# Patient Record
Sex: Male | Born: 1955 | Race: White | Hispanic: No | Smoking: Former smoker
Health system: Southern US, Community
[De-identification: ages and names within clinical notes are randomized; demographics above are authoritative.]

## PROBLEM LIST (undated history)

## (undated) DIAGNOSIS — I639 Cerebral infarction, unspecified: Secondary | ICD-10-CM

## (undated) DIAGNOSIS — K219 Gastro-esophageal reflux disease without esophagitis: Secondary | ICD-10-CM

## (undated) DIAGNOSIS — D61818 Other pancytopenia: Secondary | ICD-10-CM

## (undated) DIAGNOSIS — Z95828 Presence of other vascular implants and grafts: Secondary | ICD-10-CM

## (undated) DIAGNOSIS — N319 Neuromuscular dysfunction of bladder, unspecified: Secondary | ICD-10-CM

## (undated) DIAGNOSIS — D696 Thrombocytopenia, unspecified: Secondary | ICD-10-CM

## (undated) DIAGNOSIS — G528 Disorders of other specified cranial nerves: Secondary | ICD-10-CM

## (undated) DIAGNOSIS — I340 Nonrheumatic mitral (valve) insufficiency: Secondary | ICD-10-CM

## (undated) DIAGNOSIS — E7800 Pure hypercholesterolemia, unspecified: Secondary | ICD-10-CM

## (undated) DIAGNOSIS — N200 Calculus of kidney: Secondary | ICD-10-CM

## (undated) DIAGNOSIS — C801 Malignant (primary) neoplasm, unspecified: Secondary | ICD-10-CM

## (undated) DIAGNOSIS — E119 Type 2 diabetes mellitus without complications: Secondary | ICD-10-CM

## (undated) DIAGNOSIS — I2510 Atherosclerotic heart disease of native coronary artery without angina pectoris: Secondary | ICD-10-CM

## (undated) DIAGNOSIS — I10 Essential (primary) hypertension: Secondary | ICD-10-CM

## (undated) DIAGNOSIS — J45909 Unspecified asthma, uncomplicated: Secondary | ICD-10-CM

## (undated) HISTORY — PX: NECK SURGERY: SHX720

## (undated) HISTORY — PX: CHOLECYSTECTOMY: SHX55

## (undated) HISTORY — PX: SPHINCTEROTOMY: SHX5279

## (undated) HISTORY — PX: SPINE SURGERY: SHX786

## (undated) HISTORY — PX: ERCP: SHX60

---

## 2002-07-06 DIAGNOSIS — I340 Nonrheumatic mitral (valve) insufficiency: Secondary | ICD-10-CM

## 2002-07-06 HISTORY — DX: Nonrheumatic mitral (valve) insufficiency: I34.0

## 2012-04-20 ENCOUNTER — Emergency Department: Payer: Self-pay | Admitting: Emergency Medicine

## 2014-10-04 ENCOUNTER — Ambulatory Visit
Admit: 2014-10-04 | Disposition: A | Discharge status: Home / Self Care | Payer: Self-pay | Attending: Family Medicine | Admitting: Family Medicine

## 2014-11-02 ENCOUNTER — Ambulatory Visit
Admit: 2014-11-02 | Disposition: A | Discharge status: Home / Self Care | Payer: Self-pay | Attending: Family Medicine | Admitting: Family Medicine

## 2014-11-02 LAB — RAPID STREP-A WITH REFLX: Micro Text Report: NEGATIVE

## 2014-11-05 LAB — BETA STREP CULTURE(ARMC)

## 2015-04-06 DIAGNOSIS — I639 Cerebral infarction, unspecified: Secondary | ICD-10-CM

## 2015-04-06 HISTORY — DX: Cerebral infarction, unspecified: I63.9

## 2015-07-10 ENCOUNTER — Encounter: Payer: Self-pay | Admitting: Gynecology

## 2015-07-10 ENCOUNTER — Ambulatory Visit
Admission: EM | Admit: 2015-07-10 | Discharge: 2015-07-10 | Disposition: A | Discharge status: Home / Self Care | Payer: BLUE CROSS/BLUE SHIELD | Attending: Family Medicine | Admitting: Family Medicine

## 2015-07-10 DIAGNOSIS — J029 Acute pharyngitis, unspecified: Secondary | ICD-10-CM | POA: Diagnosis present

## 2015-07-10 DIAGNOSIS — J4521 Mild intermittent asthma with (acute) exacerbation: Secondary | ICD-10-CM | POA: Insufficient documentation

## 2015-07-10 HISTORY — DX: Gastro-esophageal reflux disease without esophagitis: K21.9

## 2015-07-10 HISTORY — DX: Disorders of other specified cranial nerves: G52.8

## 2015-07-10 HISTORY — DX: Presence of other vascular implants and grafts: Z95.828

## 2015-07-10 HISTORY — DX: Nonrheumatic mitral (valve) insufficiency: I34.0

## 2015-07-10 HISTORY — DX: Neuromuscular dysfunction of bladder, unspecified: N31.9

## 2015-07-10 HISTORY — DX: Other pancytopenia: D61.818

## 2015-07-10 HISTORY — DX: Malignant (primary) neoplasm, unspecified: C80.1

## 2015-07-10 HISTORY — DX: Atherosclerotic heart disease of native coronary artery without angina pectoris: I25.10

## 2015-07-10 HISTORY — DX: Pure hypercholesterolemia, unspecified: E78.00

## 2015-07-10 HISTORY — DX: Cerebral infarction, unspecified: I63.9

## 2015-07-10 HISTORY — DX: Essential (primary) hypertension: I10

## 2015-07-10 HISTORY — DX: Type 2 diabetes mellitus without complications: E11.9

## 2015-07-10 HISTORY — DX: Thrombocytopenia, unspecified: D69.6

## 2015-07-10 HISTORY — DX: Unspecified asthma, uncomplicated: J45.909

## 2015-07-10 HISTORY — DX: Calculus of kidney: N20.0

## 2015-07-10 LAB — RAPID STREP SCREEN (MED CTR MEBANE ONLY): Streptococcus, Group A Screen (Direct): NEGATIVE

## 2015-07-10 MED ORDER — GUAIFENESIN-CODEINE 100-10 MG/5ML PO SOLN: ORAL | Status: DC

## 2015-07-10 MED ORDER — AZITHROMYCIN 250 MG PO TABS: ORAL_TABLET | ORAL | Status: DC

## 2015-07-10 NOTE — ED Provider Notes (Signed)
CSN: [id number]     Arrival date & time 07/10/15  [id number] History   First MD Initiated Contact with Patient 07/10/15 [phone]     Chief Complaint  Patient presents with  . Sore Throat   (Consider location/radiation/quality/duration/timing/severity/associated sxs/prior Treatment) Patient is a 60 y.o. male presenting with pharyngitis and URI. The history is provided by the patient.  Sore Throat  URI Presenting symptoms: congestion, cough, fatigue, fever, rhinorrhea and sore throat   Severity:  Moderate Onset quality:  Sudden Duration:  2 days Timing:  Constant Progression:  Worsening Chronicity:  New Relieved by:  Inhaler Associated symptoms: wheezing   Risk factors: chronic respiratory disease, diabetes mellitus and sick contacts     Past Medical History  Diagnosis Date  . Asthma   . Cancer ([place])     skin  . Diabetes mellitus without complication ([place])   . Hypertension   . Stroke ([place]) 04/2015  . MI (mitral incompetence) 2004  . Hypercholesteremia   . Spinal accessory neuropathy   . Port-a-cath in place     urination  . GERD (gastroesophageal reflux disease)   . Hypercholesteremia   . Thrombocytopenia ([place])   . Pancytopenia ([place])   . Neurogenic bladder   . Kidney stone   . Coronary artery disease    Past Surgical History  Procedure Laterality Date  . Cholecystectomy    . Spine surgery    . Neck surgery    . Ercp    . Sphincterotomy     No family history on file. Social History  Substance Use Topics  . Smoking status: Former Research scientist (life sciences)  . Smokeless tobacco: None  . Alcohol Use: No    Review of Systems  Constitutional: Positive for fever and fatigue.  HENT: Positive for congestion, rhinorrhea and sore throat.   Respiratory: Positive for cough and wheezing.     Allergies  Penicillins; Banana; Fioricet; and Levofloxacin  Home Medications   Prior to Admission medications   Medication Sig Start Date End Date Taking? Authorizing Provider  Albuterol Sulfate 108  (90 Base) MCG/ACT AEPB Inhale into the lungs.   Yes Historical Provider, MD  ALPRAZolam Duanne Moron) 0.25 MG tablet Take 0.25 mg by mouth at bedtime as needed for anxiety.   Yes Historical Provider, MD  aspirin 81 MG tablet Take 81 mg by mouth daily.   Yes Historical Provider, MD  atorvastatin (LIPITOR) 80 MG tablet Take 80 mg by mouth daily.   Yes Historical Provider, MD  citalopram (CELEXA) 20 MG tablet Take 20 mg by mouth daily.   Yes Historical Provider, MD  Fluticasone-Salmeterol (ADVAIR) 100-50 MCG/DOSE AEPB Inhale 1 puff into the lungs 2 (two) times daily.   Yes Historical Provider, MD  gabapentin (NEURONTIN) 300 MG capsule Take 300 mg by mouth 3 (three) times daily.   Yes Historical Provider, MD  glucose blood test strip 1 each by Other route as needed for other. Use as instructed   Yes Historical Provider, MD  insulin glargine (LANTUS) 100 UNIT/ML injection Inject into the skin at bedtime.   Yes Historical Provider, MD  insulin lispro (HUMALOG) 100 UNIT/ML injection Inject into the skin 3 (three) times daily before meals.   Yes Historical Provider, MD  losartan (COZAAR) 25 MG tablet Take 25 mg by mouth daily.   Yes Historical Provider, MD  metaxalone (SKELAXIN) 800 MG tablet Take 800 mg by mouth 3 (three) times daily.   Yes Historical Provider, MD  metFORMIN (GLUCOPHAGE) 1000 MG tablet Take 1,000 mg by mouth  2 (two) times daily with a meal.   Yes Historical Provider, MD  Multiple Vitamins-Minerals (MULTIVITAMIN WITH MINERALS) tablet Take 1 tablet by mouth daily.   Yes Historical Provider, MD  nitrofurantoin, macrocrystal-monohydrate, (MACROBID) 100 MG capsule Take 100 mg by mouth 2 (two) times daily.   Yes Historical Provider, MD  oxybutynin (DITROPAN-XL) 10 MG 24 hr tablet Take 10 mg by mouth at bedtime.   Yes Historical Provider, MD  oxycodone (OXY-IR) 5 MG capsule Take 5 mg by mouth every 4 (four) hours as needed.   Yes Historical Provider, MD  tiZANidine (ZANAFLEX) 4 MG tablet Take 4 mg by  mouth every 6 (six) hours as needed for muscle spasms.   Yes Historical Provider, MD  traZODone (DESYREL) 150 MG tablet Take by mouth at bedtime.   Yes Historical Provider, MD  vitamin E (VITAMIN E) 400 UNIT capsule Take 400 Units by mouth daily.   Yes Historical Provider, MD  azithromycin (ZITHROMAX Z-PAK) 250 MG tablet 2 tabs po once day 1, then 1 tab po qd for next 4 days 07/10/15   Norval Gable, MD  guaiFENesin-codeine 100-10 MG/5ML syrup 1-2 teaspoons po qhs prn cough 07/10/15   Norval Gable, MD   Meds Ordered and Administered this Visit  Medications - No data to display  BP 112/50 mmHg  Pulse 93  Temp(Src) 99.2 F (37.3 C) (Tympanic)  Resp 18  Ht 5\' 10"  (1.778 m)  Wt 250 lb (113.399 kg)  BMI 35.87 kg/m2  SpO2 96% No data found.   Physical Exam  Constitutional: He appears well-developed and well-nourished. No distress.  HENT:  Head: Normocephalic and atraumatic.  Right Ear: Tympanic membrane, external ear and ear canal normal.  Left Ear: Tympanic membrane, external ear and ear canal normal.  Nose: Rhinorrhea present.  Mouth/Throat: Uvula is midline and mucous membranes are normal. Posterior oropharyngeal erythema present. No oropharyngeal exudate, posterior oropharyngeal edema or tonsillar abscesses.  Eyes: Conjunctivae and EOM are normal. Pupils are equal, round, and reactive to light. Right eye exhibits no discharge. Left eye exhibits no discharge. No scleral icterus.  Neck: Normal range of motion. Neck supple. No tracheal deviation present. No thyromegaly present.  Cardiovascular: Normal rate, regular rhythm and normal heart sounds.   Pulmonary/Chest: Effort normal. No stridor. No respiratory distress. He has wheezes (few expiratory wheezes bilaterally and diffuse rhonchi bilaterally). He has no rales. He exhibits no tenderness.  Lymphadenopathy:    He has no cervical adenopathy.  Neurological: He is alert.  Skin: Skin is warm and dry. No rash noted. He is not diaphoretic.   Nursing note and vitals reviewed.   ED Course  Procedures (including critical care time)  Labs Review Labs Reviewed  RAPID STREP SCREEN (NOT AT [facility])  CULTURE, GROUP A STREP ([facility] ONLY)    Imaging Review No results found.   Visual Acuity Review  Right Eye Distance:   Left Eye Distance:   Bilateral Distance:    Right Eye Near:   Left Eye Near:    Bilateral Near:         MDM   1. Asthmatic bronchitis, mild intermittent, with acute exacerbation    Discharge Medication List as of 07/10/2015 10:23 AM    START taking these medications   Details  azithromycin (ZITHROMAX Z-PAK) 250 MG tablet 2 tabs po once day 1, then 1 tab po qd for next 4 days, Normal    guaiFENesin-codeine 100-10 MG/5ML syrup 1-2 teaspoons po qhs prn cough, Print  1. Labs results and diagnosis reviewed with patient 2. rx as per orders above; reviewed possible side effects, interactions, risks and benefits  3. Recommend supportive treatment with rest, increased fluids; continue current home inhalers (albuterol and Advair) 4. Follow-up prn if symptoms worsen or don't improve    Norval Gable, MD 07/10/15 1031

## 2015-07-10 NOTE — ED Notes (Signed)
Patient c/o sore throat / heavy breathing (history of asthma) chest congestion / greenish mucous x yesterday.

## 2015-07-12 LAB — CULTURE, GROUP A STREP (THRC)

## 2017-09-30 ENCOUNTER — Ambulatory Visit
Admission: EM | Admit: 2017-09-30 | Discharge: 2017-09-30 | Disposition: A | Discharge status: Home / Self Care | Payer: BLUE CROSS/BLUE SHIELD | Attending: Family Medicine | Admitting: Family Medicine

## 2017-09-30 ENCOUNTER — Other Ambulatory Visit: Payer: Self-pay

## 2017-09-30 ENCOUNTER — Ambulatory Visit (INDEPENDENT_AMBULATORY_CARE_PROVIDER_SITE_OTHER): Payer: BLUE CROSS/BLUE SHIELD

## 2017-09-30 DIAGNOSIS — R0602 Shortness of breath: Secondary | ICD-10-CM

## 2017-09-30 DIAGNOSIS — J069 Acute upper respiratory infection, unspecified: Secondary | ICD-10-CM

## 2017-09-30 DIAGNOSIS — R062 Wheezing: Secondary | ICD-10-CM

## 2017-09-30 DIAGNOSIS — R05 Cough: Secondary | ICD-10-CM

## 2017-09-30 MED ORDER — IPRATROPIUM-ALBUTEROL 0.5-2.5 (3) MG/3ML IN SOLN
3.0000 mL | Freq: Once | RESPIRATORY_TRACT | Status: AC
  Administered 2017-09-30: 3 mL via RESPIRATORY_TRACT

## 2017-09-30 MED ORDER — AZITHROMYCIN 250 MG PO TABS: 250.0000 mg | ORAL_TABLET | Freq: Every day | ORAL | 0 refills | Status: AC

## 2017-09-30 MED ORDER — BENZONATATE 200 MG PO CAPS: ORAL_CAPSULE | ORAL | 0 refills | Status: AC

## 2017-09-30 MED ORDER — ALBUTEROL SULFATE HFA 108 (90 BASE) MCG/ACT IN AERS: 1.0000 | INHALATION_SPRAY | Freq: Four times a day (QID) | RESPIRATORY_TRACT | 0 refills | Status: DC | PRN

## 2017-09-30 MED ORDER — ALBUTEROL SULFATE HFA 108 (90 BASE) MCG/ACT IN AERS: 1.0000 | INHALATION_SPRAY | Freq: Four times a day (QID) | RESPIRATORY_TRACT | 0 refills | Status: AC | PRN

## 2017-09-30 MED ORDER — HYDROCODONE-HOMATROPINE 5-1.5 MG/5ML PO SYRP: 5.0000 mL | ORAL_SOLUTION | Freq: Four times a day (QID) | ORAL | 0 refills | Status: AC | PRN

## 2017-09-30 NOTE — ED Provider Notes (Signed)
[facility]    CSN: [id number] Arrival date & time: 09/30/17  0830     History   Chief Complaint Chief Complaint  Patient presents with  . Cough  . Wheezing    HPI Cameron Johnston is a 62 y.o. male.   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  He states that whenever he is recumbent at night he is coughing all night long.  Had similar symptoms a month ago which never improved.  He visited [facility] and they told him that he has to have the cough for over 2 weeks before being treated.  Does not have any sinus issues it is all in his chest.  He does not remember any sick contacts.  He is afebrile but has not been running a fever.  Respirations are 24 O2 sats are 98% on room air with audible wheezing.  History of asthma and diabetes along with other comorbidities.        Past Medical History:  Diagnosis Date  . Asthma   . Cancer ([place])    skin  . Coronary artery disease   . Diabetes mellitus without complication ([place])   . GERD (gastroesophageal reflux disease)   . Hypercholesteremia   . Hypercholesteremia   . Hypertension   . Kidney stone   . MI (mitral incompetence) 2004  . Neurogenic bladder   . Pancytopenia ([place])   . Port-A-Cath in place    urination  . Spinal accessory neuropathy   . Stroke ([place]) 04/2015  . Thrombocytopenia ([place])     There are no active problems to display for this patient.   Past Surgical History:  Procedure Laterality Date  . CHOLECYSTECTOMY    . ERCP    . NECK SURGERY    . SPHINCTEROTOMY    . SPINE SURGERY         Home Medications    Prior to Admission medications   Medication Sig Start Date End Date Taking? Authorizing Provider  baclofen (LIORESAL) 10 MG tablet Take 10 mg by mouth at bedtime as needed for muscle spasms.   Yes [provider]  Dulaglutide (TRULICITY) 1.5  TD/3.2KG SOPN Inject into the skin.   Yes [provider]  albuterol (PROVENTIL HFA;VENTOLIN HFA) 108 (90 Base) MCG/ACT inhaler Inhale 1-2 puffs into the lungs every 6 (six) hours as needed for wheezing or shortness of breath. 09/30/17   Lorin Picket, PA-C  ALPRAZolam Duanne Moron) 0.25 MG tablet Take 0.25 mg by mouth at bedtime as needed for anxiety.    [provider]  aspirin 81 MG tablet Take 81 mg by mouth daily.    [provider]  atorvastatin (LIPITOR) 80 MG tablet Take 80 mg by mouth daily.    [provider]  azithromycin (ZITHROMAX) 250 MG tablet Take 1 tablet (250 mg total) by mouth daily. Take first 2 tablets together, then 1 every day until finished. 09/30/17   Lorin Picket, PA-C  benzonatate (TESSALON) 200 MG capsule Take one cap TID PRN cough 09/30/17   Lorin Picket, PA-C  citalopram (CELEXA) 20 MG tablet Take 20 mg by mouth daily.    [provider]  Fluticasone-Salmeterol (ADVAIR) 100-50 MCG/DOSE AEPB Inhale 1 puff into the lungs 2 (two) times daily.    [provider]  gabapentin (NEURONTIN) 300 MG capsule Take 300 mg by mouth 3 (three) times  daily.    [provider]  glucose blood test strip 1 each by Other route as needed for other. Use as instructed    [provider]  HYDROcodone-homatropine (HYCODAN) 5-1.5 MG/5ML syrup Take 5 mLs by mouth every 6 (six) hours as needed for cough. 09/30/17   Lorin Picket, PA-C  insulin glargine (LANTUS) 100 UNIT/ML injection Inject into the skin at bedtime.    [provider]  insulin lispro (HUMALOG) 100 UNIT/ML injection Inject into the skin 3 (three) times daily before meals.    [provider]  losartan (COZAAR) 25 MG tablet Take 25 mg by mouth daily.    [provider]  metaxalone (SKELAXIN) 800 MG tablet Take 800 mg by mouth 3 (three) times daily.    [provider]  metFORMIN (GLUCOPHAGE) 1000 MG tablet Take 1,000 mg by  mouth 2 (two) times daily with a meal.    [provider]  Multiple Vitamins-Minerals (MULTIVITAMIN WITH MINERALS) tablet Take 1 tablet by mouth daily.    [provider]  oxybutynin (DITROPAN-XL) 10 MG 24 hr tablet Take 10 mg by mouth at bedtime.    [provider]  oxycodone (OXY-IR) 5 MG capsule Take 5 mg by mouth every 4 (four) hours as needed.    [provider]  tiZANidine (ZANAFLEX) 4 MG tablet Take 4 mg by mouth every 6 (six) hours as needed for muscle spasms.    [provider]  traZODone (DESYREL) 150 MG tablet Take by mouth at bedtime.    [provider]  vitamin E (VITAMIN E) 400 UNIT capsule Take 400 Units by mouth daily.    [provider]    Family History History reviewed. No pertinent family history.  Social History Social History   Tobacco Use  . Smoking status: Former Research scientist (life sciences)  . Smokeless tobacco: Never Used  Substance Use Topics  . Alcohol use: No  . Drug use: No     Allergies   Penicillins; Banana; Fioricet [butalbital-apap-caffeine]; and Levofloxacin   Review of Systems Review of Systems  Constitutional: Negative for activity change, appetite change, chills, fatigue and fever.  HENT: Positive for congestion.   Respiratory: Positive for cough, shortness of breath and wheezing.   All other systems reviewed and are negative.    Physical Exam Triage Vital Signs ED Triage Vitals  Enc Vitals Group     BP 09/30/17 0846 140/61     Pulse Rate 09/30/17 0846 95     Resp 09/30/17 0846 (!) 24     Temp 09/30/17 0846 98.5 F (36.9 C)     Temp Source 09/30/17 0846 Oral     SpO2 09/30/17 0846 98 %     Weight 09/30/17 0847 223 lb (101.2 kg)     Height 09/30/17 0847 5\' 10"  (1.778 m)     Head Circumference --      Peak Flow --      Pain Score 09/30/17 0847 0     Pain Loc --      Pain Edu? --      Excl. in [place]? --    No data found.  Updated Vital Signs BP 140/61   Pulse 95   Temp 98.5 F (36.9  C) (Oral)   Resp (!) 24   Ht 5\' 10"  (1.778 m)   Wt 223 lb (101.2 kg)   SpO2 98%   BMI 32.00 kg/m   Visual Acuity Right Eye Distance:   Left Eye Distance:  Bilateral Distance:    Right Eye Near:   Left Eye Near:    Bilateral Near:     Physical Exam  Constitutional: He is oriented to person, place, and time. He appears well-developed and well-nourished. No distress.  HENT:  Head: Normocephalic.  Right Ear: External ear normal.  Left Ear: External ear normal.  Nose: Nose normal.  Mouth/Throat: Oropharynx is clear and moist. No oropharyngeal exudate.  Right canal is occluded with cerumen  Eyes: Pupils are equal, round, and reactive to light. Right eye exhibits no discharge. Left eye exhibits no discharge.  Neck: Normal range of motion. Neck supple.  Pulmonary/Chest: Effort normal. He has wheezes. He has rales.  Musculoskeletal: Normal range of motion.  Lymphadenopathy:    He has no cervical adenopathy.  Neurological: He is alert and oriented to person, place, and time.  Skin: Skin is warm and dry. He is not diaphoretic.  Psychiatric: He has a normal mood and affect. His behavior is normal. Judgment and thought content normal.  Nursing note and vitals reviewed.    UC Treatments / Results  Labs (all labs ordered are listed, but only abnormal results are displayed) Labs Reviewed - No data to display  EKG None Radiology Dg Chest 2 View  Result Date: 09/30/2017 CLINICAL DATA:  Productive cough and shortness of breath for the past week. History of asthma, former smoker. EXAM: CHEST - 2 VIEW COMPARISON:  Chest x-ray of October 04, 2014 FINDINGS: The lungs are adequately inflated. The interstitial markings are mildly increased overall. The cardiac silhouette remains enlarged. The central pulmonary vascularity is prominent and more conspicuous than on the previous study. There is no pleural effusion. The mediastinum is normal in width. The bony thorax exhibits no acute  abnormalities. There is chronic deformity of the left scapula. IMPRESSION: Findings suggestive of low-grade interstitial edema of cardiac or noncardiac cause. No alveolar pneumonia. Electronically Signed   By: David  Martinique M.D.   On: 09/30/2017 10:13    Procedures Procedures (including critical care time)  Medications Ordered in UC Medications  ipratropium-albuterol (DUONEB) 0.5-2.5 (3) MG/3ML nebulizer solution 3 mL (3 mLs Nebulization Given 09/30/17 1013)     Initial Impression / Assessment and Plan / UC Course  I have reviewed the triage vital signs and the nursing notes.  Pertinent labs & imaging results that were available during my care of the patient were reviewed by me and considered in my medical decision making (see chart for details).     Plan: 1. Test/x-ray results and diagnosis reviewed with patient 2. rx as per orders; risks, benefits, potential side effects reviewed with patient 3. Recommend supportive treatment with rest and limited fluids.  States he has no cardiac history and states coronary artery disease was actually from muscle spasm according to the patient.  Had no history of CHF.  He worsens I have told him he should go to the emergency room for further evaluation because of the interstitial edema most likely not from a cardiac source. 4. F/u prn if symptoms worsen or don't improve   Final Clinical Impressions(s) / UC Diagnoses   Final diagnoses:  Upper respiratory tract infection, unspecified type    ED Discharge Orders        Ordered    azithromycin (ZITHROMAX) 250 MG tablet  Daily     09/30/17 1032    benzonatate (TESSALON) 200 MG capsule     09/30/17 1032    albuterol (PROVENTIL HFA;VENTOLIN HFA) 108 (90 Base) MCG/ACT inhaler  Every 6 hours PRN,   Status:  Discontinued     09/30/17 1032    HYDROcodone-homatropine (HYCODAN) 5-1.5 MG/5ML syrup  Every 6 hours PRN     09/30/17 1032    albuterol (PROVENTIL HFA;VENTOLIN HFA) 108 (90 Base) MCG/ACT  inhaler  Every 6 hours PRN,   Status:  Discontinued     09/30/17 1035    albuterol (PROVENTIL HFA;VENTOLIN HFA) 108 (90 Base) MCG/ACT inhaler  Every 6 hours PRN     09/30/17 1042       Controlled Substance Prescriptions Industry Controlled Substance Registry consulted? Not Applicable   Lorin Picket, PA-C 09/30/17 1050

## 2017-09-30 NOTE — ED Triage Notes (Signed)
Pt with one week of cough productive of greenish phlegm, chest congestion, and wheezing.

## 2019-06-27 IMAGING — CR DG CHEST 2V
3 series · 3 of 3 positions shown · non-contrast
Comparison: Chest x-ray of October 04, 2014

CLINICAL DATA: Productive cough and shortness of breath for the
past week. History of asthma, former smoker.

EXAM:
CHEST - 2 VIEW

[chest ap]
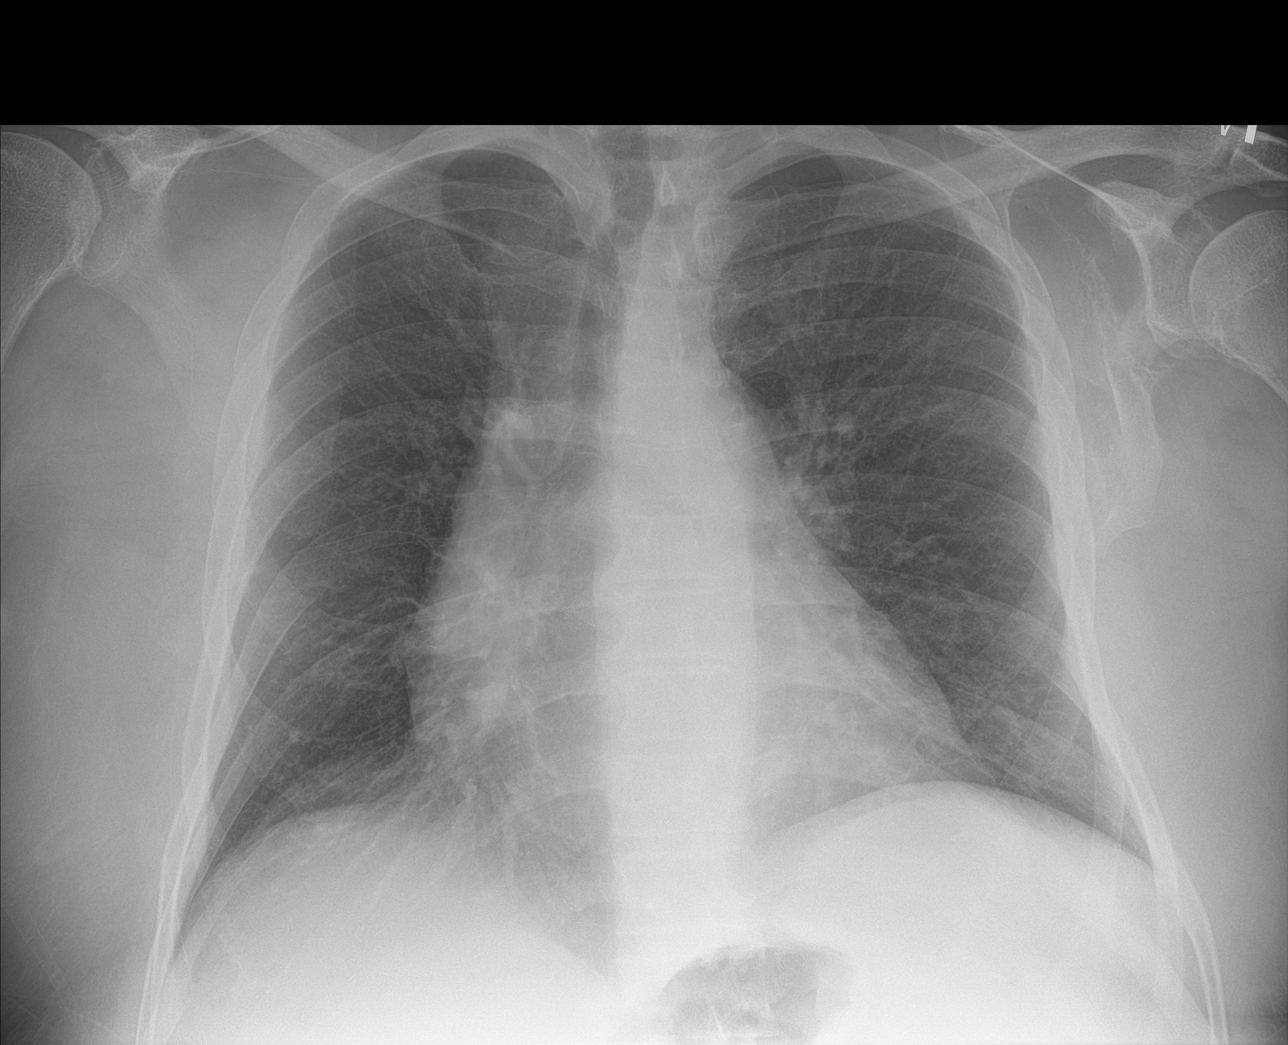

[chest lat (1 of 2)]
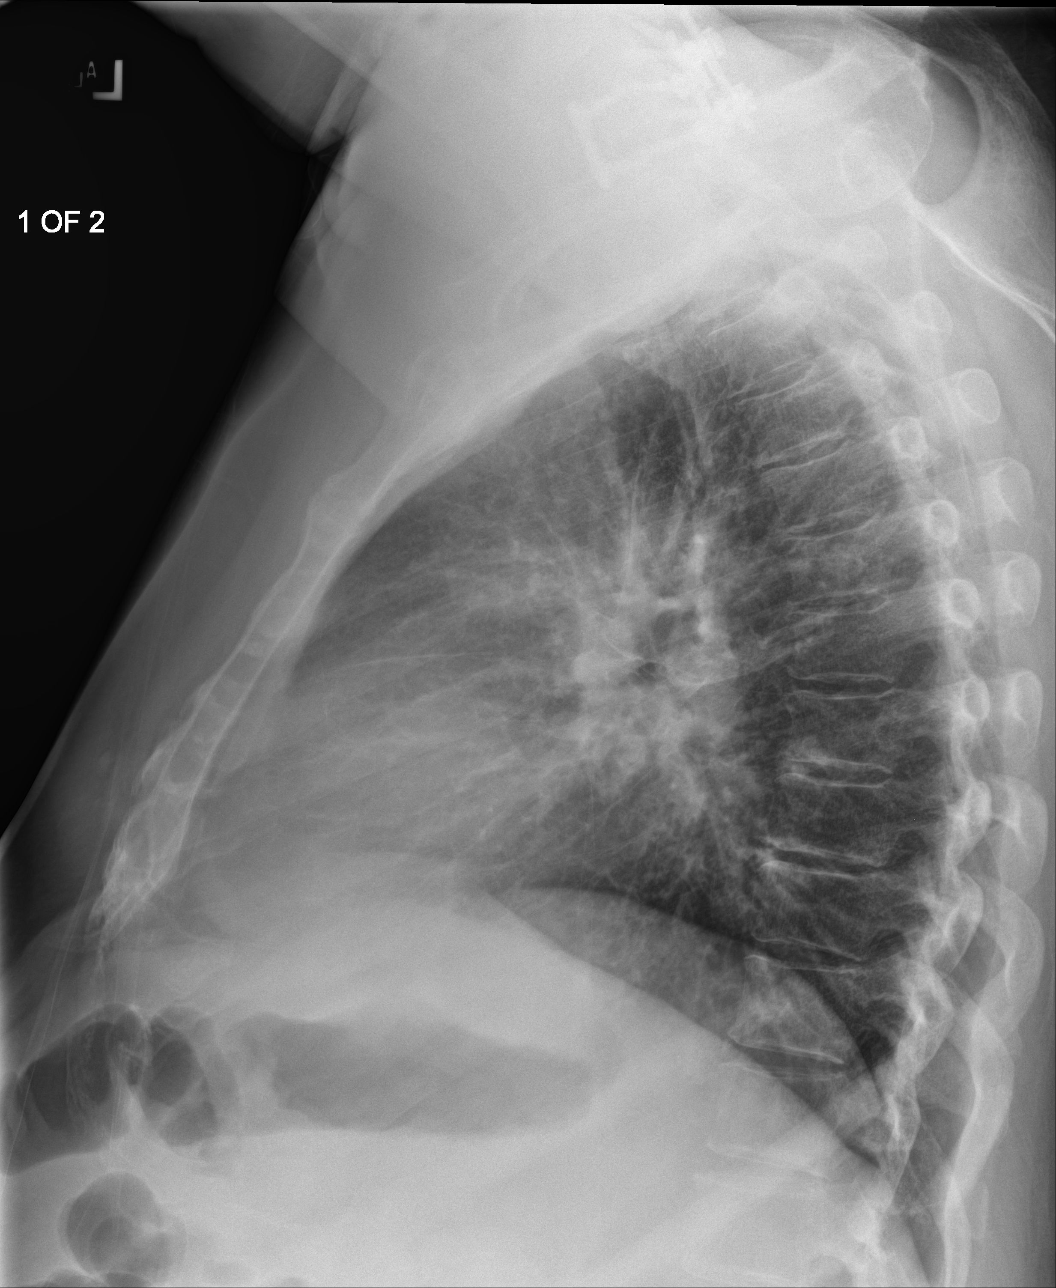

[chest lat (2 of 2)]
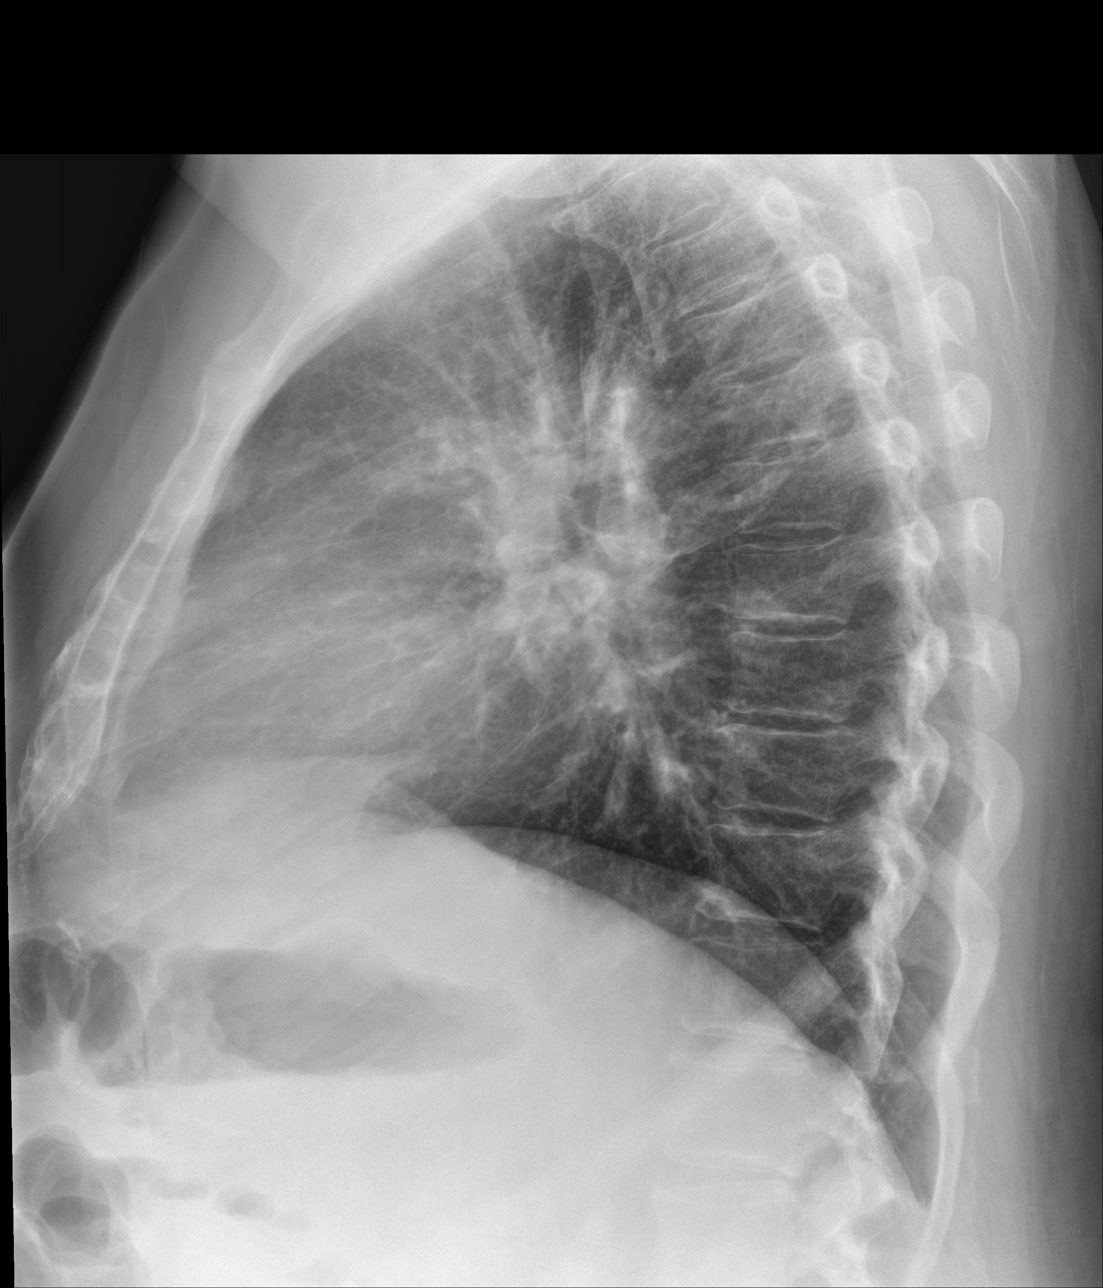

[3 of 3 positions shown; findings below may reference images not displayed]

FINDINGS: The lungs are adequately inflated. The interstitial markings are
mildly increased overall. The cardiac silhouette remains enlarged.
The central pulmonary vascularity is prominent and more conspicuous
than on the previous study. There is no pleural effusion. The
mediastinum is normal in width. The bony thorax exhibits no acute
abnormalities. There is chronic deformity of the left scapula.
IMPRESSION: Findings suggestive of low-grade interstitial edema of cardiac or
noncardiac cause. No alveolar pneumonia.
